# Patient Record
Sex: Female | Born: 1940 | Race: Asian | Hispanic: No | Smoking: Never smoker
Health system: Southern US, Community
[De-identification: ages and names within clinical notes are randomized; demographics above are authoritative.]

## PROBLEM LIST (undated history)

## (undated) DIAGNOSIS — I10 Essential (primary) hypertension: Secondary | ICD-10-CM

## (undated) DIAGNOSIS — E785 Hyperlipidemia, unspecified: Secondary | ICD-10-CM

## (undated) DIAGNOSIS — E119 Type 2 diabetes mellitus without complications: Secondary | ICD-10-CM

## (undated) DIAGNOSIS — J45909 Unspecified asthma, uncomplicated: Secondary | ICD-10-CM

## (undated) HISTORY — PX: ABDOMINAL HYSTERECTOMY: SHX81

## (undated) HISTORY — DX: Type 2 diabetes mellitus without complications: E11.9

## (undated) HISTORY — DX: Essential (primary) hypertension: I10

## (undated) HISTORY — DX: Hyperlipidemia, unspecified: E78.5

## (undated) HISTORY — DX: Unspecified asthma, uncomplicated: J45.909

## (undated) HISTORY — PX: CHOLECYSTECTOMY: SHX55

---

## 1997-08-23 ENCOUNTER — Emergency Department (HOSPITAL_COMMUNITY): Admission: EM | Admit: 1997-08-23 | Discharge: 1997-08-23 | Payer: Self-pay | Admitting: Emergency Medicine

## 1998-06-30 ENCOUNTER — Emergency Department (HOSPITAL_COMMUNITY): Admission: EM | Admit: 1998-06-30 | Discharge: 1998-06-30 | Payer: Self-pay | Admitting: Emergency Medicine

## 1998-06-30 ENCOUNTER — Encounter: Payer: Self-pay | Admitting: Emergency Medicine

## 1999-03-03 ENCOUNTER — Ambulatory Visit (HOSPITAL_COMMUNITY): Admission: RE | Admit: 1999-03-03 | Discharge: 1999-03-03 | Payer: Self-pay | Admitting: Family Medicine

## 1999-03-03 ENCOUNTER — Encounter: Payer: Self-pay | Admitting: Family Medicine

## 1999-03-10 ENCOUNTER — Ambulatory Visit (HOSPITAL_COMMUNITY): Admission: RE | Admit: 1999-03-10 | Discharge: 1999-03-10 | Payer: Self-pay | Admitting: Family Medicine

## 1999-03-10 ENCOUNTER — Encounter: Payer: Self-pay | Admitting: Family Medicine

## 2000-09-13 ENCOUNTER — Emergency Department (HOSPITAL_COMMUNITY): Admission: EM | Admit: 2000-09-13 | Discharge: 2000-09-14 | Payer: Self-pay | Admitting: Emergency Medicine

## 2002-05-08 ENCOUNTER — Ambulatory Visit (HOSPITAL_COMMUNITY): Admission: RE | Admit: 2002-05-08 | Discharge: 2002-05-08 | Payer: Self-pay | Admitting: *Deleted

## 2002-05-08 ENCOUNTER — Encounter: Payer: Self-pay | Admitting: *Deleted

## 2002-07-08 ENCOUNTER — Ambulatory Visit (HOSPITAL_COMMUNITY): Admission: RE | Admit: 2002-07-08 | Discharge: 2002-07-08 | Payer: Self-pay | Admitting: Gastroenterology

## 2002-07-09 ENCOUNTER — Encounter: Payer: Self-pay | Admitting: Surgery

## 2002-07-09 ENCOUNTER — Observation Stay (HOSPITAL_COMMUNITY): Admission: RE | Admit: 2002-07-09 | Discharge: 2002-07-10 | Payer: Self-pay | Admitting: Surgery

## 2004-10-21 ENCOUNTER — Emergency Department (HOSPITAL_COMMUNITY): Admission: EM | Admit: 2004-10-21 | Discharge: 2004-10-21 | Payer: Self-pay | Admitting: Emergency Medicine

## 2006-06-18 ENCOUNTER — Emergency Department (HOSPITAL_COMMUNITY): Admission: EM | Admit: 2006-06-18 | Discharge: 2006-06-18 | Payer: Self-pay | Admitting: Emergency Medicine

## 2015-12-23 ENCOUNTER — Encounter: Payer: Self-pay | Admitting: Physician Assistant

## 2015-12-23 ENCOUNTER — Ambulatory Visit (INDEPENDENT_AMBULATORY_CARE_PROVIDER_SITE_OTHER): Payer: Medicare Other | Admitting: Physician Assistant

## 2015-12-23 ENCOUNTER — Telehealth: Payer: Self-pay

## 2015-12-23 ENCOUNTER — Ambulatory Visit (INDEPENDENT_AMBULATORY_CARE_PROVIDER_SITE_OTHER): Payer: Medicare Other

## 2015-12-23 VITALS — BP 150/80 | HR 83 | Temp 98.1°F | Ht 58.5 in | Wt 114.4 lb

## 2015-12-23 DIAGNOSIS — J45909 Unspecified asthma, uncomplicated: Secondary | ICD-10-CM

## 2015-12-23 DIAGNOSIS — I10 Essential (primary) hypertension: Secondary | ICD-10-CM | POA: Diagnosis not present

## 2015-12-23 DIAGNOSIS — G8929 Other chronic pain: Secondary | ICD-10-CM

## 2015-12-23 DIAGNOSIS — M545 Low back pain, unspecified: Secondary | ICD-10-CM

## 2015-12-23 DIAGNOSIS — E785 Hyperlipidemia, unspecified: Secondary | ICD-10-CM | POA: Diagnosis not present

## 2015-12-23 DIAGNOSIS — Z7189 Other specified counseling: Secondary | ICD-10-CM

## 2015-12-23 DIAGNOSIS — E119 Type 2 diabetes mellitus without complications: Secondary | ICD-10-CM | POA: Diagnosis not present

## 2015-12-23 DIAGNOSIS — Z7689 Persons encountering health services in other specified circumstances: Secondary | ICD-10-CM

## 2015-12-23 DIAGNOSIS — M4316 Spondylolisthesis, lumbar region: Secondary | ICD-10-CM | POA: Diagnosis not present

## 2015-12-23 LAB — CBC WITH DIFFERENTIAL/PLATELET
BASOS PCT: 1 %
Basophils Absolute: 68 cells/uL (ref 0–200)
EOS PCT: 5 %
Eosinophils Absolute: 340 cells/uL (ref 15–500)
HCT: 43.2 % (ref 35.0–45.0)
Hemoglobin: 14.7 g/dL (ref 11.7–15.5)
LYMPHS PCT: 34 %
Lymphs Abs: 2312 cells/uL (ref 850–3900)
MCH: 31.4 pg (ref 27.0–33.0)
MCHC: 34 g/dL (ref 32.0–36.0)
MCV: 92.3 fL (ref 80.0–100.0)
MONO ABS: 884 {cells}/uL (ref 200–950)
MONOS PCT: 13 %
MPV: 10.1 fL (ref 7.5–12.5)
Neutro Abs: 3196 cells/uL (ref 1500–7800)
Neutrophils Relative %: 47 %
PLATELETS: 287 10*3/uL (ref 140–400)
RBC: 4.68 MIL/uL (ref 3.80–5.10)
RDW: 13.4 % (ref 11.0–15.0)
WBC: 6.8 10*3/uL (ref 3.8–10.8)

## 2015-12-23 LAB — LIPID PANEL
CHOLESTEROL: 131 mg/dL (ref 125–200)
HDL: 49 mg/dL (ref >=46)
LDL Cholesterol: 56 mg/dL (ref <130)
TRIGLYCERIDES: 129 mg/dL (ref <150)
Total CHOL/HDL Ratio: 2.7 Ratio (ref <=5.0)
VLDL: 26 mg/dL (ref <30)

## 2015-12-23 LAB — MICROALBUMIN, URINE: Microalb, Ur: 1.5 mg/dL

## 2015-12-23 LAB — TSH: TSH: 1.78 m[IU]/L

## 2015-12-23 MED ORDER — GLUCOSE BLOOD VI STRP: 1.0000 | ORAL_STRIP | 3 refills | Status: DC | PRN

## 2015-12-23 MED ORDER — ACCU-CHEK SOFT TOUCH LANCETS MISC: 1.0000 | 3 refills | Status: DC | PRN

## 2015-12-23 MED ORDER — AMLODIPINE BESYLATE 10 MG PO TABS: 10.0000 mg | ORAL_TABLET | Freq: Every day | ORAL | 3 refills | Status: AC

## 2015-12-23 MED ORDER — MELOXICAM 15 MG PO TABS: 15.0000 mg | ORAL_TABLET | Freq: Every day | ORAL | 3 refills | Status: DC

## 2015-12-23 MED ORDER — LOVASTATIN 10 MG PO TABS: 10.0000 mg | ORAL_TABLET | Freq: Every day | ORAL | 3 refills | Status: AC

## 2015-12-23 MED ORDER — BUDESONIDE-FORMOTEROL FUMARATE 80-4.5 MCG/ACT IN AERO: 2.0000 | INHALATION_SPRAY | Freq: Two times a day (BID) | RESPIRATORY_TRACT | 3 refills | Status: AC

## 2015-12-23 MED ORDER — METFORMIN HCL 500 MG PO TABS: 500.0000 mg | ORAL_TABLET | Freq: Two times a day (BID) | ORAL | 3 refills | Status: AC

## 2015-12-23 NOTE — Telephone Encounter (Signed)
Pharmacist needs a CB concerning glucose blood test strip [[id number]][[id number]]. She has [employer] insurance and they require specific directions with this med. Alfred at [phone]

## 2015-12-23 NOTE — Progress Notes (Signed)
Patient ID: Mary Patrick, female     DOB: 10/17/1940, 75 y.o.    MRN: [id number]  PCP: No PCP Per Patient, previously followed by Dr. Mayford KnifeWilliams, who's office has closed  Chief Complaint  Patient presents with  . Hip Pain    in buttock area x 3 month  . Establish Care    Subjective:    HPI  Presents for evaluation of diabetes, HTN, elevated lipids and new LEFT buttock pain, as well as to establish care.  Unfortunately, the previous office she went to has closed and she doesn't know how to get any records. We will need to access the point of care tool to see if we can identify what health maintenance items are actually outstanding. She relates that she was being seen every 6 months.  Checks glucose BID. 163 this am. 144 last night. Denies hypoglycemia. Last eye exam 9-10 months ago, usually every 2-3 years. Dental exams Q6 months  For the past month, she has experienced pain in the RIGHT buttock when she first stands up, causing her to have to lean forward, flexed at the hips. Once she moves about for several minutes, she is able to stand erect. No radicular pain. Some tingling in both feet and both hands. Diclofenac helps her chronic low back pain, but has not helped this buttock pain.    Prior to Admission medications   Medication Sig Start Date End Date Taking? Authorizing Provider  amLODipine (NORVASC) 10 MG tablet Take 10 mg by mouth daily.   Yes Historical Provider, MD  budesonide-formoterol (SYMBICORT) 80-4.5 MCG/ACT inhaler Inhale 2 puffs into the lungs 2 (two) times daily.   Yes Historical Provider, MD  diclofenac (VOLTAREN) 75 MG EC tablet Take 75 mg by mouth 2 (two) times daily.   Yes Historical Provider, MD  glucose blood test strip 1 each by Other route as needed for other. Use as instructed   Yes Historical Provider, MD  Lancets (ACCU-CHEK SOFT TOUCH) lancets 1 each by Other route as needed for other. Use as instructed   Yes Historical Provider, MD  lovastatin  (MEVACOR) 10 MG tablet Take 10 mg by mouth at bedtime.   Yes Historical Provider, MD  meloxicam (MOBIC) 15 MG tablet Take 15 mg by mouth daily.   Yes Historical Provider, MD  metFORMIN (GLUCOPHAGE) 500 MG tablet Take by mouth 2 (two) times daily with a meal.   Yes Historical Provider, MD     Not on File   Patient Active Problem List   Diagnosis Date Noted  . Diabetes type 2, controlled (HCC) 12/23/2015  . Benign essential HTN 12/23/2015  . Hyperlipidemia 12/23/2015  . Asthma, chronic 12/23/2015     No family history on file.   Social History   Social History  . Marital status: Divorced    Spouse name: n/a  . Number of children: 4  . Years of education: 7th grade   Occupational History  . retired     Building surveyorplastic manufacturing   Social History Main Topics  . Smoking status: Never Smoker  . Smokeless tobacco: Never Used  . Alcohol use 0.6 oz/week    1 Glasses of wine per week  . Drug use: No  . Sexual activity: Not on file   Other Topics Concern  . Not on file   Social History Narrative   Divorced since 1997, after about 20 years of marriage.   Lives alone.   Her children live in [place], [place] and [place], [place].  Review of Systems  Constitutional: Negative for activity change, appetite change, fatigue and unexpected weight change.  HENT: Negative for congestion, dental problem, ear pain, hearing loss, mouth sores, postnasal drip, rhinorrhea, sneezing, sore throat, tinnitus and trouble swallowing.   Eyes: Negative for photophobia, pain, redness and visual disturbance.  Respiratory: Negative for cough, chest tightness and shortness of breath.   Cardiovascular: Negative for chest pain, palpitations and leg swelling.  Gastrointestinal: Negative for abdominal pain, blood in stool, constipation, diarrhea, nausea and vomiting.  Genitourinary: Negative for dysuria, frequency, hematuria and urgency.  Musculoskeletal: Positive for back pain. Negative  for arthralgias, gait problem, myalgias and neck stiffness.  Skin: Negative for rash.  Neurological: Positive for numbness. Negative for dizziness, speech difficulty, weakness, light-headedness and headaches.  Hematological: Negative for adenopathy.  Psychiatric/Behavioral: Negative for confusion and sleep disturbance. The patient is not nervous/anxious.          Objective:  Physical Exam  Constitutional: She is oriented to person, place, and time. She appears well-developed and well-nourished. No distress.  BP (!) 150/80   Pulse 83   Temp 98.1 F (36.7 C) (Oral)   Ht 4' 10.5" (1.486 m)   Wt 114 lb 6.4 oz (51.9 kg)   LMP  (LMP Unknown)   BMI 23.50 kg/m    Eyes: Conjunctivae are normal. No scleral icterus.  Neck: Neck supple. No thyromegaly present.  Cardiovascular: Normal rate, regular rhythm, normal heart sounds and intact distal pulses.   Pulmonary/Chest: Effort normal and breath sounds normal.  Musculoskeletal:       Cervical back: Normal.       Thoracic back: Normal.       Lumbar back: She exhibits tenderness and pain. She exhibits normal range of motion, no bony tenderness and no spasm.  Lymphadenopathy:    She has no cervical adenopathy.  Neurological: She is alert and oriented to person, place, and time.  Skin: Skin is warm and dry.  Psychiatric: She has a normal mood and affect. Her speech is normal and behavior is normal.        Diabetic Foot Exam - Simple   Simple Foot Form Diabetic Foot exam was performed with the following findings:  Yes 12/23/2015 10:38 AM  Visual Inspection No deformities, no ulcerations, no other skin breakdown bilaterally:  Yes Sensation Testing See comments:  Yes Pulse Check Posterior Tibialis and Dorsalis pulse intact bilaterally:  Yes Comments No sensation to monofilament testing on the RIGHT foot, and no sensation to monofilament testing of the LEFT toes and LEFT heel.      Dg Lumbar Spine Complete  Result Date:  12/23/2015 CLINICAL DATA:  Chronic pain . EXAM: LUMBAR SPINE - COMPLETE 4+ VIEW COMPARISON:  Chest x-ray 06/07/2006 . FINDINGS: Surgical clips right upper quadrant . Lumbar scoliosis concave right. 4 mm anterolisthesis L4 on L5 . Diffuse multilevel degenerative change. Diffuse osteopenia. Aortoiliac atherosclerotic vascular calcification. Calcified injection granulomas noted the buttocks. IMPRESSION: 1. Diffuse multilevel degenerative change with 4 mm anterolisthesis L4 on L5. Lumbar spine scoliosis concave right. 2. Aortoiliac atherosclerotic vascular calcification. Electronically Signed   By: Maisie Fushomas  Register   On: 12/23/2015 11:11        Assessment & Plan:  1. Encounter to establish care  2. Controlled type 2 diabetes mellitus without complication, without long-term current use of insulin (HCC) Await labs. Adjust regimen as indicated by results. - HM Diabetes Eye Exam - HM Diabetes Foot Exam - Microalbumin, urine - metFORMIN (GLUCOPHAGE) 500 MG tablet; Take 1  tablet (500 mg total) by mouth 2 (two) times daily with a meal.  Dispense: 180 tablet; Refill: 3 - Lancets (ACCU-CHEK SOFT TOUCH) lancets; 1 each by Other route as needed for other. Use as instructed  Dispense: 100 each; Refill: 3 - glucose blood test strip; 1 each by Other route as needed for other. Use as instructed  Dispense: 100 each; Refill: 3  3. Benign essential HTN Above goal today. If renal function is normal, plan to add ACEI. - CBC with Differential/Platelet - TSH - amLODipine (NORVASC) 10 MG tablet; Take 1 tablet (10 mg total) by mouth daily.  Dispense: 90 tablet; Refill: 3  4. Hyperlipidemia Await labs. Adjust regimen as indicated by results. - Lipid panel - lovastatin (MEVACOR) 10 MG tablet; Take 1 tablet (10 mg total) by mouth at bedtime.  Dispense: 90 tablet; Refill: 3  5. Asthma, chronic, unspecified asthma severity, uncomplicated Controlled. Stable. Continue current treatment. - budesonide-formoterol  (SYMBICORT) 80-4.5 MCG/ACT inhaler; Inhale 2 puffs into the lungs 2 (two) times daily.  Dispense: 3 Inhaler; Refill: 3  6. Chronic left-sided low back pain without sciatica 7. Spondylolisthesis at L4-L5 level She cannot take both Diclofenac and meloxicam. She selects meloxicam for once daily dosing and greater benefit. Ask Dr. Dion Saucier to see her again (she has a bottle of meloxicam that he prescribed, but doesn't recall her visit with him). - DG Lumbar Spine Complete; Future - meloxicam (MOBIC) 15 MG tablet; Take 1 tablet (15 mg total) by mouth daily.  Dispense: 30 tablet; Refill: 3 - Ambulatory referral to Orthopedic Surgery   Fernande Bras, PA-C Physician Assistant-Certified [facility]

## 2015-12-23 NOTE — Patient Instructions (Signed)
     IF you received an x-ray today, you will receive an invoice from [facility]. Please contact [facility] at [phone] with questions or concerns regarding your invoice.   IF you received labwork today, you will receive an invoice from [facility]. Please contact [facility] at [phone] with questions or concerns regarding your invoice.   Our billing staff will not be able to assist you with questions regarding bills from these companies.  You will be contacted with the lab results as soon as they are available. The fastest way to get your results is to activate your My Chart account. Instructions are located on the last page of this paperwork. If you have not heard from us regarding the results in 2 weeks, please contact this office.      

## 2015-12-24 ENCOUNTER — Encounter: Payer: Self-pay | Admitting: Physician Assistant

## 2015-12-24 NOTE — Progress Notes (Signed)
Patient notified by letter

## 2015-12-26 ENCOUNTER — Other Ambulatory Visit: Payer: Self-pay

## 2015-12-26 MED ORDER — GLUCOSE BLOOD VI STRP: ORAL_STRIP | 3 refills | Status: AC

## 2015-12-26 MED ORDER — ACCU-CHEK AVIVA PLUS W/DEVICE KIT: PACK | 0 refills | Status: AC

## 2015-12-26 MED ORDER — ACCU-CHEK SOFTCLIX LANCETS MISC: 3 refills | Status: AC

## 2015-12-27 NOTE — Telephone Encounter (Signed)
I took care of this yesterday

## 2015-12-28 DIAGNOSIS — Z719 Counseling, unspecified: Secondary | ICD-10-CM

## 2015-12-28 NOTE — Congregational Nurse Program (Signed)
Congregational Nurse Program Note  Date of Encounter: 12/28/2015  Past Medical History: Past Medical History:  Diagnosis Date  . Asthma   . Diabetes mellitus without complication (HCC)   . Hyperlipidemia   . Hypertension     Encounter Details:     CNP Questionnaire - 12/28/15 2100      Patient Demographics   Is this a new or existing patient? Existing   Patient is considered a/an Immigrant   Race Asian     Patient Assistance   Location of Patient Assistance Not Applicable   Patient's financial/insurance status Medicare;Medicaid   Uninsured Patient No   Patient referred to apply for the following financial assistance Not Applicable   Food insecurities addressed Not Applicable   Transportation assistance No   Assistance securing medications Yes   Type of Assistance Other   Educational health offerings Medications;Navigating the healthcare system     Encounter Details   Primary purpose of visit Chronic Illness/Condition Visit;Acute Illness/Condition Visit;Navigating the Healthcare System   Was an Emergency Department visit averted? Not Applicable   Patient referred to Not Applicable   Was a mental health screening completed? (GAINS tool) No   Does patient have dental issues? No   Does patient have vision issues? No   Does your patient have an abnormal blood pressure today? No   Since previous encounter, have you referred patient for abnormal blood pressure that resulted in a new diagnosis or medication change? No   Does your patient have an abnormal blood glucose today? No   Since previous encounter, have you referred patient for abnormal blood glucose that resulted in a new diagnosis or medication change? No   Was there a life-saving intervention made? No      Home visit with interpreter Mary Patrick.  Reviewed medications prescribed at [facility] on [address] Last week. Still having low back pain but has not started pain medication.  Will start it  today. She was  referred to Eul[facility] Pont[facility]phy and [facility] but [facility].  Resident Service Coordinator Talmadge Coventryracy Joslin helped patient contact her Medicaid caseworker today to update Medicaid Card.Brantley Flingarolyn o'Brien RN, Congregational Nurse [phone]

## 2016-03-19 ENCOUNTER — Other Ambulatory Visit: Payer: Self-pay | Admitting: *Deleted

## 2016-03-19 ENCOUNTER — Other Ambulatory Visit: Payer: Self-pay | Admitting: Physician Assistant

## 2016-03-19 DIAGNOSIS — M545 Low back pain: Principal | ICD-10-CM

## 2016-03-19 DIAGNOSIS — N644 Mastodynia: Secondary | ICD-10-CM

## 2016-03-19 DIAGNOSIS — G8929 Other chronic pain: Secondary | ICD-10-CM

## 2016-03-19 NOTE — Telephone Encounter (Signed)
12/2015 last ov and refill 

## 2016-03-19 NOTE — Telephone Encounter (Signed)
Meds ordered this encounter  Medications  . meloxicam (MOBIC) 15 MG tablet    Sig: TAKE ONE TABLET BY MOUTH ONCE DAILY    Dispense:  30 tablet    Refill:  0    Needs OV and fasting labs.

## 2016-03-23 ENCOUNTER — Ambulatory Visit
Admission: RE | Admit: 2016-03-23 | Discharge: 2016-03-23 | Disposition: A | Discharge status: Home / Self Care | Payer: Medicare Other | Source: Ambulatory Visit | Attending: *Deleted | Admitting: *Deleted

## 2016-03-23 DIAGNOSIS — N644 Mastodynia: Secondary | ICD-10-CM

## 2016-06-27 ENCOUNTER — Telehealth: Payer: Self-pay

## 2016-06-27 NOTE — Telephone Encounter (Signed)
L/m at [facility], we have not seen since 12/2015 Has seen new provider 03/2016 and no mention of increase. Please contact that provider or have pt see us for refills/changes

## 2016-06-27 NOTE — Telephone Encounter (Signed)
[facility] is calling stating that patient told them that the provider had increased the dosage of the amaoditine 10mg  and that they need a new rx showing that   Best number [phone]

## 2016-09-26 ENCOUNTER — Other Ambulatory Visit: Payer: Self-pay

## 2017-03-30 ENCOUNTER — Other Ambulatory Visit: Payer: Self-pay | Admitting: Physician Assistant

## 2017-03-30 DIAGNOSIS — E785 Hyperlipidemia, unspecified: Secondary | ICD-10-CM

## 2017-03-30 DIAGNOSIS — J45909 Unspecified asthma, uncomplicated: Secondary | ICD-10-CM

## 2017-04-18 ENCOUNTER — Other Ambulatory Visit: Payer: Self-pay | Admitting: Physician Assistant

## 2017-04-18 DIAGNOSIS — E119 Type 2 diabetes mellitus without complications: Secondary | ICD-10-CM

## 2017-04-18 DIAGNOSIS — J45909 Unspecified asthma, uncomplicated: Secondary | ICD-10-CM

## 2017-04-18 DIAGNOSIS — E785 Hyperlipidemia, unspecified: Secondary | ICD-10-CM

## 2017-07-09 ENCOUNTER — Other Ambulatory Visit: Payer: Self-pay | Admitting: Physician Assistant

## 2017-07-09 DIAGNOSIS — E2839 Other primary ovarian failure: Secondary | ICD-10-CM

## 2017-07-22 ENCOUNTER — Ambulatory Visit
Admission: RE | Admit: 2017-07-22 | Discharge: 2017-07-22 | Disposition: A | Discharge status: Home / Self Care | Payer: Medicare Other | Source: Ambulatory Visit | Attending: Physician Assistant | Admitting: Physician Assistant

## 2017-07-22 DIAGNOSIS — E2839 Other primary ovarian failure: Secondary | ICD-10-CM

## 2017-08-12 ENCOUNTER — Encounter: Payer: Self-pay | Admitting: Physician Assistant

## 2019-11-24 ENCOUNTER — Other Ambulatory Visit: Payer: Self-pay | Admitting: Physician Assistant

## 2019-11-24 DIAGNOSIS — E2839 Other primary ovarian failure: Secondary | ICD-10-CM

## 2020-01-28 ENCOUNTER — Other Ambulatory Visit (HOSPITAL_COMMUNITY): Payer: Self-pay | Admitting: Registered Nurse

## 2020-01-28 DIAGNOSIS — I509 Heart failure, unspecified: Secondary | ICD-10-CM

## 2020-02-15 ENCOUNTER — Other Ambulatory Visit (HOSPITAL_COMMUNITY): Payer: Medicare Other

## 2020-03-09 ENCOUNTER — Other Ambulatory Visit (HOSPITAL_COMMUNITY): Payer: Medicare Other

## 2020-03-09 ENCOUNTER — Encounter (HOSPITAL_COMMUNITY): Payer: Self-pay

## 2020-03-09 NOTE — Progress Notes (Signed)
Verified appointment "no show" status with Henriette Combs at 10:29.

## 2020-04-05 ENCOUNTER — Other Ambulatory Visit: Payer: Self-pay

## 2020-04-05 ENCOUNTER — Ambulatory Visit
Admission: RE | Admit: 2020-04-05 | Discharge: 2020-04-05 | Disposition: A | Discharge status: Home / Self Care | Payer: Medicare Other | Source: Ambulatory Visit | Attending: Physician Assistant | Admitting: Physician Assistant

## 2020-04-05 DIAGNOSIS — E2839 Other primary ovarian failure: Secondary | ICD-10-CM

## 2020-04-06 ENCOUNTER — Ambulatory Visit (HOSPITAL_COMMUNITY): Payer: Medicare Other | Attending: Registered Nurse

## 2020-04-06 DIAGNOSIS — I509 Heart failure, unspecified: Secondary | ICD-10-CM | POA: Insufficient documentation

## 2020-04-06 LAB — ECHOCARDIOGRAM COMPLETE
Area-P 1/2: 2.57 cm2
S' Lateral: 2.5 cm

## 2020-12-28 ENCOUNTER — Telehealth: Payer: Self-pay

## 2020-12-28 NOTE — Telephone Encounter (Signed)
Patient had asked interpreter Diu Hartshorn if we could help her order a medical alert necklace.  Order placed through same company her neighbor has.  Due to lack of funding available today ordered will be processed on 01/10/2021 and should deliver 4-6 days later.  Brantley Fling RN, Congregational Nurse [phone]

## 2021-01-13 ENCOUNTER — Other Ambulatory Visit: Payer: Self-pay | Admitting: Registered Nurse

## 2021-01-13 DIAGNOSIS — R7401 Elevation of levels of liver transaminase levels: Secondary | ICD-10-CM

## 2021-01-19 NOTE — Congregational Nurse Program (Signed)
Home visit to help patient activate medical alert device which she just received. We did a test call and it was working properly. She will place lockbox with house key on window sill outside front door. She questioned if it would work in other states when she travelled.  Phone call to tech support where we learned it works anywhere in [place].  She just has to call customer service to let them know the address of where she is staying and then let them know when she returns home.  Patient signed service agreement and will mail it to them.  Advised patient she could contact CN through interpreter Diu Hartshorn if she has future questions.  Brantley Fling RN, Congregational Nurse [phone]

## 2021-01-24 ENCOUNTER — Other Ambulatory Visit: Payer: Medicare Other

## 2021-01-30 ENCOUNTER — Ambulatory Visit
Admission: RE | Admit: 2021-01-30 | Discharge: 2021-01-30 | Disposition: A | Discharge status: Home / Self Care | Payer: Medicare Other | Source: Ambulatory Visit | Attending: Registered Nurse | Admitting: Registered Nurse

## 2021-01-30 DIAGNOSIS — R7401 Elevation of levels of liver transaminase levels: Secondary | ICD-10-CM

## 2021-06-20 ENCOUNTER — Other Ambulatory Visit (HOSPITAL_COMMUNITY): Payer: Self-pay | Admitting: Gastroenterology

## 2021-06-20 DIAGNOSIS — K754 Autoimmune hepatitis: Secondary | ICD-10-CM

## 2021-06-30 ENCOUNTER — Other Ambulatory Visit (HOSPITAL_COMMUNITY): Payer: Self-pay | Admitting: Physician Assistant

## 2021-07-01 ENCOUNTER — Other Ambulatory Visit: Payer: Self-pay | Admitting: Student

## 2021-07-03 ENCOUNTER — Other Ambulatory Visit: Payer: Self-pay

## 2021-07-03 ENCOUNTER — Encounter (HOSPITAL_COMMUNITY): Payer: Self-pay

## 2021-07-03 ENCOUNTER — Ambulatory Visit (HOSPITAL_COMMUNITY)
Admission: RE | Admit: 2021-07-03 | Discharge: 2021-07-03 | Disposition: A | Discharge status: Home / Self Care | Payer: Medicare Other | Source: Ambulatory Visit | Attending: Gastroenterology | Admitting: Gastroenterology

## 2021-07-03 DIAGNOSIS — K754 Autoimmune hepatitis: Secondary | ICD-10-CM | POA: Insufficient documentation

## 2021-07-03 DIAGNOSIS — R7989 Other specified abnormal findings of blood chemistry: Secondary | ICD-10-CM | POA: Insufficient documentation

## 2021-07-03 LAB — CBC
HCT: 39.7 % (ref 36.0–46.0)
Hemoglobin: 13.6 g/dL (ref 12.0–15.0)
MCH: 31.6 pg (ref 26.0–34.0)
MCHC: 34.3 g/dL (ref 30.0–36.0)
MCV: 92.1 fL (ref 80.0–100.0)
Platelets: 238 10*3/uL (ref 150–400)
RBC: 4.31 MIL/uL (ref 3.87–5.11)
RDW: 13 % (ref 11.5–15.5)
WBC: 10 10*3/uL (ref 4.0–10.5)
nRBC: 0 % (ref 0.0–0.2)

## 2021-07-03 LAB — GLUCOSE, CAPILLARY: Glucose-Capillary: 128 mg/dL — ABNORMAL HIGH (ref 70–99)

## 2021-07-03 LAB — PROTIME-INR
INR: 1 (ref 0.8–1.2)
Prothrombin Time: 13.3 seconds (ref 11.4–15.2)

## 2021-07-03 MED ORDER — GELATIN ABSORBABLE 12-7 MM EX MISC
CUTANEOUS | Status: AC
  Filled 2021-07-03: qty 1

## 2021-07-03 MED ORDER — LIDOCAINE HCL (PF) 1 % IJ SOLN
INTRAMUSCULAR | Status: AC
  Filled 2021-07-03: qty 30

## 2021-07-03 MED ORDER — MIDAZOLAM HCL 2 MG/2ML IJ SOLN
INTRAMUSCULAR | Status: AC
  Filled 2021-07-03: qty 4

## 2021-07-03 MED ORDER — HYDROCODONE-ACETAMINOPHEN 5-325 MG PO TABS: 1.0000 | ORAL_TABLET | ORAL | Status: DC | PRN

## 2021-07-03 MED ORDER — SODIUM CHLORIDE 0.9 % IV SOLN: INTRAVENOUS | Status: DC

## 2021-07-03 MED ORDER — FENTANYL CITRATE (PF) 100 MCG/2ML IJ SOLN
INTRAMUSCULAR | Status: DC | PRN
  Administered 2021-07-03: 50 ug via INTRAVENOUS

## 2021-07-03 MED ORDER — FENTANYL CITRATE (PF) 100 MCG/2ML IJ SOLN
INTRAMUSCULAR | Status: AC
  Filled 2021-07-03: qty 4

## 2021-07-03 MED ORDER — MIDAZOLAM HCL 2 MG/2ML IJ SOLN
INTRAMUSCULAR | Status: DC | PRN
  Administered 2021-07-03: 1 mg via INTRAVENOUS
  Administered 2021-07-03: .5 mg via INTRAVENOUS

## 2021-07-03 NOTE — Progress Notes (Signed)
Interpretor in [employer]

## 2021-07-03 NOTE — Procedures (Signed)
°  Procedure: US liver core biopsy R lobe x2 EBL:   minimal Complications:  none immediate  See full dictation in [employer].  Thora Lance MD Main # [phone] Pager  [phone] Mobile [phone]

## 2021-07-03 NOTE — H&P (Signed)
Chief Complaint: Patient was seen in consultation today for random liver core biopsy at the request of Ronnette Juniper  Referring Physician(s): Ronnette Juniper  Supervising Physician: Michaelle Birks  Patient Status: [facility] - Out-pt  History of Present Illness: Mary Patrick is a 81 y.o. female    Elevated liver functions-- Elevated ASMA Noted since Sept 2022 Follows with Dr Therisa Doyne  Requesting liver core biopsy   Past Medical History:  Diagnosis Date   Asthma    Diabetes mellitus without complication ([place])    Hyperlipidemia    Hypertension     Past Surgical History:  Procedure Laterality Date   ABDOMINAL HYSTERECTOMY     CHOLECYSTECTOMY      Allergies: Lisinopril  Medications: Prior to Admission medications   Medication Sig Start Date End Date Taking? Authorizing Provider  amLODipine (NORVASC) 10 MG tablet Take 1 tablet (10 mg total) by mouth daily. 12/23/15  Yes Jeffery, Domingo Mend, PA  budesonide-formoterol (SYMBICORT) 80-4.5 MCG/ACT inhaler Inhale 2 puffs into the lungs 2 (two) times daily. 12/23/15  Yes Jeffery, Chelle, PA  carvedilol (COREG) 6.25 MG tablet Take 6.25 mg by mouth 2 (two) times daily. 06/12/21  Yes [provider]  gabapentin (NEURONTIN) 100 MG capsule Take 100 mg by mouth 2 (two) times daily. 06/12/21  Yes [provider]  JANUVIA 100 MG tablet Take 100 mg by mouth daily. 06/12/21  Yes [provider]  losartan (COZAAR) 100 MG tablet Take 100 mg by mouth daily. 06/12/21  Yes [provider]  metFORMIN (GLUCOPHAGE) 500 MG tablet Take 1 tablet (500 mg total) by mouth 2 (two) times daily with a meal. 12/23/15  Yes Jeffery, Chelle, PA  VITAMIN D HIGH POTENCY 25 MCG (1000 UT) capsule Take 1,000 Units by mouth daily. 06/12/21  Yes [provider]  ACCU-CHEK SOFTCLIX LANCETS lancets Test blood sugar once daily. Dx: E11.9 12/26/15   Harrison Mons, PA  Blood Glucose Monitoring Suppl (ACCU-CHEK AVIVA PLUS) w/Device KIT Test blood sugar  once daily. Dx: E11.9 12/26/15   Harrison Mons, PA  glucose blood (ACCU-CHEK AVIVA PLUS) test strip Test blood sugar once daily. Dx: E11.9 12/26/15   Harrison Mons, PA  lovastatin (MEVACOR) 10 MG tablet Take 1 tablet (10 mg total) by mouth at bedtime. Patient not taking: Reported on 07/03/2021 12/23/15   Harrison Mons, PA  meloxicam ([place]) 15 MG tablet TAKE ONE TABLET BY MOUTH ONCE DAILY Patient not taking: Reported on 07/03/2021 03/19/16   Harrison Mons, PA     History reviewed. No pertinent family history.  Social History   Socioeconomic History   Marital status: Divorced    Spouse name: n/a   Number of children: 4   Years of education: 7th grade   Highest education level: Not on file  Occupational History   Occupation: retired    Comment: Systems developer  Tobacco Use   Smoking status: Never   Smokeless tobacco: Never  Substance and Sexual Activity   Alcohol use: Yes    Alcohol/week: 1.0 standard drink    Types: 1 Glasses of wine per week   Drug use: No   Sexual activity: Not on file  Other Topics Concern   Not on file  Social History Narrative   Divorced since 1997, after about 20 years of marriage.   Lives alone.   Her children live in [place], [place] and [place], [place], [place].   Social Determinants of Health   Financial Resource Strain: Not on file  Food Insecurity: Not on file  Transportation Needs:  Not on file  Physical Activity: Not on file  Stress: Not on file  Social Connections: Not on file     Review of Systems: A 12 point ROS discussed and pertinent positives are indicated in the HPI above.  All other systems are negative.    Vital Signs: BP 134/67    Pulse 69    Temp 98.2 F (36.8 C) (Oral)    Resp 17    Ht _0  (1.27 m)    Wt 115 lb (52.2 kg)    LMP  (LMP Unknown)    SpO2 98%    BMI 32.34 kg/m   Physical Exam Vitals reviewed.  HENT:     Mouth/Throat:     Mouth: Mucous membranes are moist.  Cardiovascular:     Rate and  Rhythm: Normal rate and regular rhythm.     Heart sounds: Normal heart sounds.  Pulmonary:     Effort: Pulmonary effort is normal.     Breath sounds: Normal breath sounds.  Abdominal:     Palpations: Abdomen is soft.     Tenderness: There is no abdominal tenderness.  Musculoskeletal:        General: Normal range of motion.  Skin:    General: Skin is warm.  Neurological:     Mental Status: She is alert and oriented to person, place, and time.  Psychiatric:        Behavior: Behavior normal.    Imaging: No results found.  Labs:  CBC: No results for input(s): WBC, HGB, HCT, PLT in the last 8760 hours.  COAGS: No results for input(s): INR, APTT in the last 8760 hours.  BMP: No results for input(s): NA, K, CL, CO2, GLUCOSE, BUN, CALCIUM, CREATININE, GFRNONAA, GFRAA in the last 8760 hours.  Invalid input(s): CMP  LIVER FUNCTION TESTS: No results for input(s): BILITOT, AST, ALT, ALKPHOS, PROT, ALBUMIN in the last 8760 hours.  TUMOR MARKERS: No results for input(s): AFPTM, CEA, CA199, CHROMGRNA in the last 8760 hours.  Assessment and Plan:  Elevated LFTs Elevated ASMA GI asking for random liver core biopsy Risks and benefits of random liver core biopsy was discussed with the patient and/or patient's family (with interpreter in room) including, but not limited to bleeding, infection, damage to adjacent structures or low yield requiring additional tests.  All of the questions were answered and there is agreement to proceed Consent signed and in chart.   Thank you for this interesting consult.  I greatly enjoyed meeting Zitlali Gerken and look forward to participating in their care.  A copy of this report was sent to the requesting provider on this date.  Electronically Signed: Lavonia Drafts, PA-C 07/03/2021, 12:13 PM   I spent a total of  30 Minutes   in face to face in clinical consultation, greater than 50% of which was counseling/coordinating care for random liver core  bx

## 2021-07-04 LAB — SURGICAL PATHOLOGY

## 2021-09-02 ENCOUNTER — Emergency Department (HOSPITAL_COMMUNITY): Payer: Medicare Other

## 2021-09-02 ENCOUNTER — Emergency Department (HOSPITAL_COMMUNITY)
Admission: EM | Admit: 2021-09-02 | Discharge: 2021-09-03 | Disposition: A | Discharge status: Home / Self Care | Payer: Medicare Other | Attending: Emergency Medicine | Admitting: Emergency Medicine

## 2021-09-02 DIAGNOSIS — R55 Syncope and collapse: Secondary | ICD-10-CM | POA: Diagnosis not present

## 2021-09-02 DIAGNOSIS — Z79899 Other long term (current) drug therapy: Secondary | ICD-10-CM | POA: Insufficient documentation

## 2021-09-02 DIAGNOSIS — R079 Chest pain, unspecified: Secondary | ICD-10-CM | POA: Diagnosis not present

## 2021-09-02 DIAGNOSIS — I10 Essential (primary) hypertension: Secondary | ICD-10-CM | POA: Insufficient documentation

## 2021-09-02 DIAGNOSIS — W01198A Fall on same level from slipping, tripping and stumbling with subsequent striking against other object, initial encounter: Secondary | ICD-10-CM | POA: Insufficient documentation

## 2021-09-02 DIAGNOSIS — Z7984 Long term (current) use of oral hypoglycemic drugs: Secondary | ICD-10-CM | POA: Insufficient documentation

## 2021-09-02 DIAGNOSIS — Y92002 Bathroom of unspecified non-institutional (private) residence single-family (private) house as the place of occurrence of the external cause: Secondary | ICD-10-CM | POA: Diagnosis not present

## 2021-09-02 DIAGNOSIS — E119 Type 2 diabetes mellitus without complications: Secondary | ICD-10-CM | POA: Insufficient documentation

## 2021-09-02 DIAGNOSIS — M546 Pain in thoracic spine: Secondary | ICD-10-CM | POA: Insufficient documentation

## 2021-09-02 LAB — BASIC METABOLIC PANEL
Anion gap: 11 (ref 5–15)
BUN: 9 mg/dL (ref 8–23)
CO2: 21 mmol/L — ABNORMAL LOW (ref 22–32)
Calcium: 9.4 mg/dL (ref 8.9–10.3)
Chloride: 99 mmol/L (ref 98–111)
Creatinine, Ser: 0.62 mg/dL (ref 0.44–1.00)
GFR, Estimated: >60 mL/min (ref >60)
Glucose, Bld: 149 mg/dL — ABNORMAL HIGH (ref 70–99)
Potassium: 4.1 mmol/L (ref 3.5–5.1)
Sodium: 131 mmol/L — ABNORMAL LOW (ref 135–145)

## 2021-09-02 LAB — CBC WITH DIFFERENTIAL/PLATELET
Abs Immature Granulocytes: 0.07 10*3/uL (ref 0.00–0.07)
Basophils Absolute: 0.1 10*3/uL (ref 0.0–0.1)
Basophils Relative: 1 %
Eosinophils Absolute: 0.1 10*3/uL (ref 0.0–0.5)
Eosinophils Relative: 1 %
HCT: 39.2 % (ref 36.0–46.0)
Hemoglobin: 13.6 g/dL (ref 12.0–15.0)
Immature Granulocytes: 1 %
Lymphocytes Relative: 19 %
Lymphs Abs: 2.5 10*3/uL (ref 0.7–4.0)
MCH: 32.2 pg (ref 26.0–34.0)
MCHC: 34.7 g/dL (ref 30.0–36.0)
MCV: 92.9 fL (ref 80.0–100.0)
Monocytes Absolute: 1.4 10*3/uL — ABNORMAL HIGH (ref 0.1–1.0)
Monocytes Relative: 10 %
Neutro Abs: 9.5 10*3/uL — ABNORMAL HIGH (ref 1.7–7.7)
Neutrophils Relative %: 68 %
Platelets: 251 10*3/uL (ref 150–400)
RBC: 4.22 MIL/uL (ref 3.87–5.11)
RDW: 13.2 % (ref 11.5–15.5)
WBC: 13.7 10*3/uL — ABNORMAL HIGH (ref 4.0–10.5)
nRBC: 0 % (ref 0.0–0.2)

## 2021-09-02 LAB — D-DIMER, QUANTITATIVE: D-Dimer, Quant: 0.37 ug/mL-FEU (ref 0.00–0.50)

## 2021-09-02 LAB — TROPONIN I (HIGH SENSITIVITY)
Troponin I (High Sensitivity): 5 ng/L (ref <18)
Troponin I (High Sensitivity): 4 ng/L (ref <18)

## 2021-09-02 MED ORDER — LIDOCAINE 5 % EX PTCH
1.0000 | MEDICATED_PATCH | CUTANEOUS | Status: DC
  Administered 2021-09-02: 1 via TRANSDERMAL
  Filled 2021-09-02: qty 1

## 2021-09-02 MED ORDER — LIDOCAINE 5 % EX PTCH: 1.0000 | MEDICATED_PATCH | CUTANEOUS | 0 refills | Status: AC

## 2021-09-02 MED ORDER — KETOROLAC TROMETHAMINE 15 MG/ML IJ SOLN
15.0000 mg | Freq: Once | INTRAMUSCULAR | Status: AC
  Administered 2021-09-02: 15 mg via INTRAVENOUS
  Filled 2021-09-02: qty 1

## 2021-09-02 MED ORDER — IOHEXOL 300 MG/ML  SOLN
100.0000 mL | Freq: Once | INTRAMUSCULAR | Status: AC | PRN
  Administered 2021-09-02: 100 mL via INTRAVENOUS

## 2021-09-02 MED ORDER — IBUPROFEN 600 MG PO TABS: 600.0000 mg | ORAL_TABLET | Freq: Four times a day (QID) | ORAL | 0 refills | Status: AC | PRN

## 2021-09-02 NOTE — ED Notes (Signed)
Patient ambulated to and from restroom with standby assistance. ?

## 2021-09-02 NOTE — ED Notes (Signed)
Patient given clothing.  Patient has no other belongings with her.  ?

## 2021-09-02 NOTE — ED Triage Notes (Signed)
Pt BIB [facility] from home d/t fall from last night in the bathroom. Pt stated it was mechanical fall. Denied hitting head or LOC. No blood thinner. Pt is diabetic. C/O severe L shoulder pain that appears swollen, warm to touch. A&O X4. VSS.  ?

## 2021-09-02 NOTE — ED Provider Notes (Addendum)
?[facility] ?Provider Note ? ? ?CSN: [id number] ?Arrival date & time: 09/02/21  1550 ? ?  ? ?History ? ?Chief Complaint  ?Patient presents with  ? Fall  ? ? ?Mary Patrick is a 81 y.o. female. ? ?Patient is a 81 year old female with past medical history of hypertension, hyperlipidemia, and diabetes with medications presenting for complaints of near-syncope.  Patient states last night she got up to go to the bathroom and when she tempted to stand up from the toilet she began to feel like she was going to pass out.  Denies any lightheadedness, dizziness, nausea, or dizziness.  States she just felt like she was going to go down.  She states she fell forward caught herself against the wall on her left arm.  Denies any left arm pain.  Denies head trauma.  Admits to left-sided upper back pain worse with deep breathing, without radiation since episode.  Prior history of DVT or PE. ? ?The history is provided by the patient. No language interpreter was used.  ?Fall ?Associated symptoms include shortness of breath. Pertinent negatives include no chest pain and no abdominal pain.  ? ?  ? ?Home Medications ?Prior to Admission medications   ?Medication Sig Start Date End Date Taking? Authorizing Provider  ?ibuprofen (ADVIL) 600 MG tablet Take 1 tablet (600 mg total) by mouth every 6 (six) hours as needed for mild pain. 08/14/79  Yes Campbell Stall P, DO  ?lidocaine (LIDODERM) 5 % Place 1 patch onto the skin daily. Remove & Discard patch within 12 hours or as directed by MD 1/91/47  Yes Lianne Cure, DO  ?ACCU-CHEK SOFTCLIX LANCETS lancets Test blood sugar once daily. Dx: E11.9 12/26/15   Harrison Mons, PA  ?amLODipine (NORVASC) 10 MG tablet Take 1 tablet (10 mg total) by mouth daily. 12/23/15   Harrison Mons, [place]  ?ANORO ELLIPTA 62.5-25 MCG/ACT AEPB Inhale 1 puff into the lungs daily. 06/12/21   [provider]  ?Blood Glucose Monitoring Suppl (ACCU-CHEK AVIVA PLUS) w/Device KIT Test  blood sugar once daily. Dx: E11.9 12/26/15   Harrison Mons, PA  ?budesonide-formoterol (SYMBICORT) 80-4.5 MCG/ACT inhaler Inhale 2 puffs into the lungs 2 (two) times daily. 12/23/15   Harrison Mons, PA  ?carvedilol (COREG) 6.25 MG tablet Take 6.25 mg by mouth 2 (two) times daily. 06/12/21   [provider]  ?gabapentin (NEURONTIN) 100 MG capsule Take 100 mg by mouth 2 (two) times daily. 06/12/21   [provider]  ?glucose blood (ACCU-CHEK AVIVA PLUS) test strip Test blood sugar once daily. Dx: E11.9 12/26/15   Harrison Mons, PA  ?JANUVIA 100 MG tablet Take 100 mg by mouth daily. 06/12/21   [provider]  ?losartan (COZAAR) 100 MG tablet Take 100 mg by mouth daily. 06/12/21   [provider]  ?lovastatin (MEVACOR) 10 MG tablet Take 1 tablet (10 mg total) by mouth at bedtime. ?Patient not taking: Reported on 07/03/2021 12/23/15   Harrison Mons, PA  ?meloxicam (MOBIC) 15 MG tablet TAKE ONE TABLET BY MOUTH ONCE DAILY ?Patient not taking: Reported on 07/03/2021 03/19/16   Harrison Mons, PA  ?metFORMIN (GLUCOPHAGE) 500 MG tablet Take 1 tablet (500 mg total) by mouth 2 (two) times daily with a meal. 12/23/15   Harrison Mons, PA  ?montelukast (SINGULAIR) 10 MG tablet Take 10 mg by mouth at bedtime.    [provider]  ?VITAMIN D HIGH POTENCY 25 MCG (1000 UT) capsule Take 1,000 Units by mouth daily. 06/12/21  [provider]  ?   ? ?Allergies    ?Lisinopril   ? ?Review of Systems   ?Review of Systems  ?Constitutional:  Negative for chills and fever.  ?HENT:  Negative for ear pain and sore throat.   ?Eyes:  Negative for pain and visual disturbance.  ?Respiratory:  Positive for shortness of breath. Negative for cough.   ?Cardiovascular:  Negative for chest pain and palpitations.  ?Gastrointestinal:  Negative for abdominal pain and vomiting.  ?Genitourinary:  Negative for dysuria and hematuria.  ?Musculoskeletal:  Positive for back pain. Negative for arthralgias.  ?Skin:   Negative for color change and rash.  ?Neurological:  Negative for dizziness, seizures and light-headedness.  ?     Near-syncope ?  ?All other systems reviewed and are negative. ? ?Physical Exam ?Updated Vital Signs ?BP (!) 154/62   Pulse 74   Temp 98.6 ?F (37 ?C) (Oral)   Resp (!) 22   LMP  (LMP Unknown)   SpO2 96%  ?Physical Exam ?Vitals and nursing note reviewed.  ?Constitutional:   ?   General: She is not in acute distress. ?   Appearance: She is well-developed.  ?HENT:  ?   Head: Normocephalic and atraumatic.  ?Eyes:  ?   Conjunctiva/sclera: Conjunctivae normal.  ?Cardiovascular:  ?   Rate and Rhythm: Normal rate and regular rhythm.  ?   Pulses:     ?     Radial pulses are 2+ on the right side and 2+ on the left side.  ?     Dorsalis pedis pulses are 2+ on the right side and 2+ on the left side.  ?   Heart sounds: No murmur heard. ?Pulmonary:  ?   Effort: Pulmonary effort is normal. No respiratory distress.  ?   Breath sounds: Normal breath sounds.  ?Abdominal:  ?   Palpations: Abdomen is soft.  ?   Tenderness: There is no abdominal tenderness.  ?Musculoskeletal:     ?   General: No swelling.  ?   Cervical back: Neck supple.  ?   Right lower leg: No edema.  ?   Left lower leg: No edema.  ?Skin: ?   General: Skin is warm and dry.  ?   Capillary Refill: Capillary refill takes less than 2 seconds.  ?Neurological:  ?   Mental Status: She is alert.  ?Psychiatric:     ?   Mood and Affect: Mood normal.  ? ? ?ED Results / Procedures / Treatments   ?Labs ?(all labs ordered are listed, but only abnormal results are displayed) ?Labs Reviewed  ?CBC WITH DIFFERENTIAL/PLATELET - Abnormal; Notable for the following components:  ?    Result Value  ? WBC 13.7 (*)   ? Neutro Abs 9.5 (*)   ? Monocytes Absolute 1.4 (*)   ? All other components within normal limits  ?BASIC METABOLIC PANEL - Abnormal; Notable for the following components:  ? Sodium 131 (*)   ? CO2 21 (*)   ? Glucose, Bld 149 (*)   ? All other components within  normal limits  ?D-DIMER, QUANTITATIVE  ?TROPONIN I (HIGH SENSITIVITY)  ?TROPONIN I (HIGH SENSITIVITY)  ? ? ?EKG ?EKG Interpretation ? ?Date/Time:  Saturday September 02 2021 16:44:57 EDT ?Ventricular Rate:  85 ?PR Interval:  157 ?QRS Duration: 82 ?QT Interval:  359 ?QTC Calculation: 427 ?R Axis:   45 ?Text Interpretation: Sinus rhythm Confirmed by Lennice Sites ([id number]) on 09/04/2021 10:58:15 AM ? ?Radiology ?No results found. ? ?  Procedures ?Procedures  ? ? ?Medications Ordered in ED ?Medications  ?iohexol (OMNIPAQUE) 300 MG/ML solution 100 mL (100 mLs Intravenous Contrast Given 09/02/21 1959)  ?ketorolac (TORADOL) 15 MG/ML injection 15 mg (15 mg Intravenous Given 09/02/21 2336)  ? ? ?ED Course/ Medical Decision Making/ A&P ?  ?                        ?Medical Decision Making ?Amount and/or Complexity of Data Reviewed ?Labs: ordered. ?Radiology: ordered. ?ECG/medicine tests: ordered. ? ?Risk ?Prescription drug management. ? ? ?75:32 PM ?81 year old female with past medical history of hypertension, hyperlipidemia, and diabetes with medications presenting for complaints of near-syncope.  Is alert and oriented x3, no acute distress, afebrile, stable vital signs.  Physical exam demonstrates reproducible tenderness with deep breathing and palpation of left-sided thoracic paraspinal muscles. ? ?ACS considered however not to be unlikely.  EKG as interpreted by myself demonstrates no ST segment elevation or depression.  No T wave inversions.  Stable intervals.  Troponin within normal limits x2.  Stable electrolytes.  Stable chest x-ray. ? ?No orthostatic hypotension. ?No anemia. ?Stable glucose. ?D-dimer negative.  Low suspicion PE. ?Stable CT chest. ? ?Back pain thought to be musculoskeletal in nature.  Patient in no distress and overall condition improved here in the ED. Detailed discussions were had with the patient regarding current findings, and need for close f/u with PCP or on call doctor. The patient has been instructed to  return immediately if the symptoms worsen in any way for re-evaluation. Patient verbalized understanding and is in agreement with current care plan. All questions answered prior to discharge. ? ? ? ? ? ? ? ?Final

## 2021-11-13 ENCOUNTER — Emergency Department (HOSPITAL_COMMUNITY): Payer: Medicare Other

## 2021-11-13 ENCOUNTER — Encounter (HOSPITAL_COMMUNITY): Payer: Self-pay

## 2021-11-13 ENCOUNTER — Other Ambulatory Visit: Payer: Self-pay

## 2021-11-13 ENCOUNTER — Emergency Department (HOSPITAL_COMMUNITY)
Admission: EM | Admit: 2021-11-13 | Discharge: 2021-11-13 | Disposition: A | Discharge status: Home / Self Care | Payer: Medicare Other | Attending: Emergency Medicine | Admitting: Emergency Medicine

## 2021-11-13 DIAGNOSIS — S60211A Contusion of right wrist, initial encounter: Secondary | ICD-10-CM

## 2021-11-13 DIAGNOSIS — M25561 Pain in right knee: Secondary | ICD-10-CM | POA: Diagnosis not present

## 2021-11-13 DIAGNOSIS — S60221A Contusion of right hand, initial encounter: Secondary | ICD-10-CM | POA: Diagnosis not present

## 2021-11-13 DIAGNOSIS — W19XXXA Unspecified fall, initial encounter: Secondary | ICD-10-CM | POA: Insufficient documentation

## 2021-11-13 DIAGNOSIS — S6991XA Unspecified injury of right wrist, hand and finger(s), initial encounter: Secondary | ICD-10-CM | POA: Diagnosis present

## 2021-11-13 DIAGNOSIS — Z79899 Other long term (current) drug therapy: Secondary | ICD-10-CM | POA: Insufficient documentation

## 2021-11-13 NOTE — ED Triage Notes (Signed)
Pt coming from home with c/o a fall. Pt states that she tripped and fell catching herself with her hands and knees. Pt c/o right wrist and knee pain. Swelling noted to right wrist and right knee. Lac noted on left knee. No blood thinners, denies LOC or hitting head.

## 2021-11-13 NOTE — ED Provider Notes (Signed)
[place] DEPT Provider Note   CSN: [id number] Arrival date & time: 11/13/21  1621     History  Chief Complaint  Patient presents with   Fall    Paiten Glaeser is a 81 y.o. female.   Fall  Patient is an 81 year old female been me speaking but understands [place].  Her daughter at bedside is translating for her.  She deferred formal translator.  Patient is coming to emergency room today after a mechanical fall that occurred today.  She states that she tripped and fell forward catching herself with her hands and putting weight on her right knee.  She states that she landed on a hardwood floor.  She denies any head injury or loss of consciousness and vomiting blood thinners denies nausea vomiting or any other pain apart from right wrist and hand pain and right knee pain.  She states her left upper and lower extremity are without pain.  She has not had any medications prior to arrival.   She denies any other associated symptoms.     Home Medications Prior to Admission medications   Medication Sig Start Date End Date Taking? Authorizing Provider  ACCU-CHEK SOFTCLIX LANCETS lancets Test blood sugar once daily. Dx: E11.9 12/26/15   Harrison Mons, PA  amLODipine (NORVASC) 10 MG tablet Take 1 tablet (10 mg total) by mouth daily. 12/23/15   Harrison Mons, PA  ANORO ELLIPTA 62.5-25 MCG/ACT AEPB Inhale 1 puff into the lungs daily. 06/12/21   [provider]  Blood Glucose Monitoring Suppl (ACCU-CHEK AVIVA PLUS) w/Device KIT Test blood sugar once daily. Dx: E11.9 12/26/15   Harrison Mons, PA  budesonide-formoterol (SYMBICORT) 80-4.5 MCG/ACT inhaler Inhale 2 puffs into the lungs 2 (two) times daily. 12/23/15   Harrison Mons, PA  carvedilol (COREG) 6.25 MG tablet Take 6.25 mg by mouth 2 (two) times daily. 06/12/21   [provider]  gabapentin (NEURONTIN) 100 MG capsule Take 100 mg by mouth 2 (two) times daily. 06/12/21   [provider]  glucose blood (ACCU-CHEK AVIVA PLUS) test strip Test blood sugar once daily. Dx: E11.9 12/26/15   Harrison Mons, PA  ibuprofen (ADVIL) 600 MG tablet Take 1 tablet (600 mg total) by mouth every 6 (six) hours as needed for mild pain. 3/81/82   Campbell Stall P, DO  JANUVIA 100 MG tablet Take 100 mg by mouth daily. 06/12/21   [provider]  lidocaine (LIDODERM) 5 % Place 1 patch onto the skin daily. Remove & Discard patch within 12 hours or as directed by MD 9/93/71   Campbell Stall P, DO  losartan (COZAAR) 100 MG tablet Take 100 mg by mouth daily. 06/12/21   [provider]  lovastatin (MEVACOR) 10 MG tablet Take 1 tablet (10 mg total) by mouth at bedtime. Patient not taking: Reported on 07/03/2021 12/23/15   Harrison Mons, PA  meloxicam (MOBIC) 15 MG tablet TAKE ONE TABLET BY MOUTH ONCE DAILY Patient not taking: Reported on 07/03/2021 03/19/16   Harrison Mons, PA  metFORMIN (GLUCOPHAGE) 500 MG tablet Take 1 tablet (500 mg total) by mouth 2 (two) times daily with a meal. 12/23/15   Jeffery, [place], PA  montelukast (SINGULAIR) 10 MG tablet Take 10 mg by mouth at bedtime.    [provider]  VITAMIN D HIGH POTENCY 25 MCG (1000 UT) capsule Take 1,000 Units by mouth daily. 06/12/21   [provider]      Allergies    Lisinopril    Review  of Systems   Review of Systems  Physical Exam Updated Vital Signs BP (!) 171/63 (BP Location: Left Arm)   Pulse 69   Temp 98.6 F (37 C)   Resp 16   Ht '4\' 2"'  (1.27 m)   Wt 52.6 kg   LMP  (LMP Unknown)   SpO2 100%   BMI 32.62 kg/m  Physical Exam Vitals and nursing note reviewed.  Constitutional:      General: She is not in acute distress.    Comments: Pleasant well-appearing 81 year old.  In no acute distress.  Sitting comfortably in bed.  Able answer questions appropriately follow commands. No increased work of breathing. Speaking in full sentences.   HENT:     Head: Normocephalic and atraumatic.     Nose: Nose  normal.  Eyes:     General: No scleral icterus. Cardiovascular:     Rate and Rhythm: Normal rate and regular rhythm.     Pulses: Normal pulses.     Heart sounds: Normal heart sounds.  Pulmonary:     Effort: Pulmonary effort is normal. No respiratory distress.     Breath sounds: No wheezing.  Abdominal:     Palpations: Abdomen is soft.     Tenderness: There is no abdominal tenderness.  Musculoskeletal:     Cervical back: Normal range of motion.     Right lower leg: No edema.     Left lower leg: No edema.     Comments: Some bruising over the right fifth metacarpal and some tenderness here as well.  No snuffbox tenderness.  Right upper extremity otherwise unremarkable.  Right lower extremity unremarkable apart from tenderness to palpation of the right knee.  There is a small skin abrasion no lacerations  Left upper and lower extremity without tenderness  No C, T, L-spine tenderness.  No evidence of head trauma.  Skin:    General: Skin is warm and dry.     Capillary Refill: Capillary refill takes less than 2 seconds.  Neurological:     Mental Status: She is alert. Mental status is at baseline.  Psychiatric:        Mood and Affect: Mood normal.        Behavior: Behavior normal.     ED Results / Procedures / Treatments   Labs (all labs ordered are listed, but only abnormal results are displayed) Labs Reviewed - No data to display  EKG None  Radiology DG Wrist Complete Right  Result Date: 11/13/2021 CLINICAL DATA:  Wrist pain fall EXAM: RIGHT WRIST - COMPLETE 3+ VIEW COMPARISON:  None Available. FINDINGS: No fracture or malalignment. Positive for soft tissue swelling. Mild degenerative change at the first [facility] joint. IMPRESSION: No acute osseous abnormality Electronically Signed   By: Donavan Foil M.D.   On: 11/13/2021 17:35   DG Knee Right Port  Result Date: 11/13/2021 CLINICAL DATA:  Fall swelling EXAM: PORTABLE RIGHT KNEE - 1-2 VIEW COMPARISON:  None Available. FINDINGS:  No fracture or malalignment. Mild joint space calcification. Mild tricompartment arthritis. 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 No acute osseous abnormality Electronically Signed   By: Donavan Foil M.D.   On: 11/13/2021 17:34   DG Hand Complete Right  Result Date: 11/13/2021 CLINICAL DATA:  Trauma fall EXAM: RIGHT HAND - COMPLETE 3+ VIEW COMPARISON:  None Available. FINDINGS: Punctate calcifications adjacent to the fifth PIP joint. No malalignment. Joint space narrowing and mild arthritis at the D IP joints. IMPRESSION:  1. Punctate calcifications at the fifth PIP joint may reflect nonspecific soft tissue calcification versus small fracture fragments, correlate for point tenderness. 2. Mild arthritis Electronically Signed   By: Donavan Foil M.D.   On: 11/13/2021 17:32    Procedures Procedures    Medications Ordered in ED Medications - No data to display  ED Course/ Medical Decision Making/ A&P                           Medical Decision Making Amount and/or Complexity of Data Reviewed Radiology: ordered.   Patient is an 81 year old female been me speaking but understands [place].  Her daughter at bedside is translating for her.  She deferred formal translator.  Patient is coming to emergency room today after a mechanical fall that occurred today.  She states that she tripped and fell forward catching herself with her hands and putting weight on her right knee.  She states that she landed on a hardwood floor.  She denies any head injury or loss of consciousness and vomiting blood thinners denies nausea vomiting or any other pain apart from right wrist and hand pain and right knee pain.  She states her left upper and lower extremity are without pain.  She has not had any medications prior to arrival.   She denies any other associated symptoms.    Patient with tenderness of right hand will obtain x-rays of right  hand and right knee.   I obtained x-rays of the right wrist, right knee, right hand.  There are no fractures.  There is some bone spurs noted at the PIP on 5th finger but her TTP is at MCP.   FROM of all fingers. Will DC home w PCP FU.   Return precautions discussed.  Patient agreeable to plan.  Final Clinical Impression(s) / ED Diagnoses Final diagnoses:  Contusion of multiple sites of right hand and wrist, initial encounter  Acute pain of right knee    Rx / DC Orders ED Discharge Orders     None         Tedd Sias, [place] 11/13/21 2034    Regan Lemming, MD 11/13/21 2151

## 2021-11-13 NOTE — Discharge Instructions (Addendum)
Please ice and elevate the areas specifically your right wrist/hand and right knee.  Take Tylenol 1000 mg every 6 hours for pain.

## 2023-12-13 IMAGING — DX DG CHEST 1V PORT
1 series · 1 of 1 positions shown · non-contrast
Comparison: 06/18/2006 chest radiograph

CLINICAL DATA: Shortness of breath and chest pain.

EXAM:
PORTABLE CHEST 1 VIEW

[chest ap]
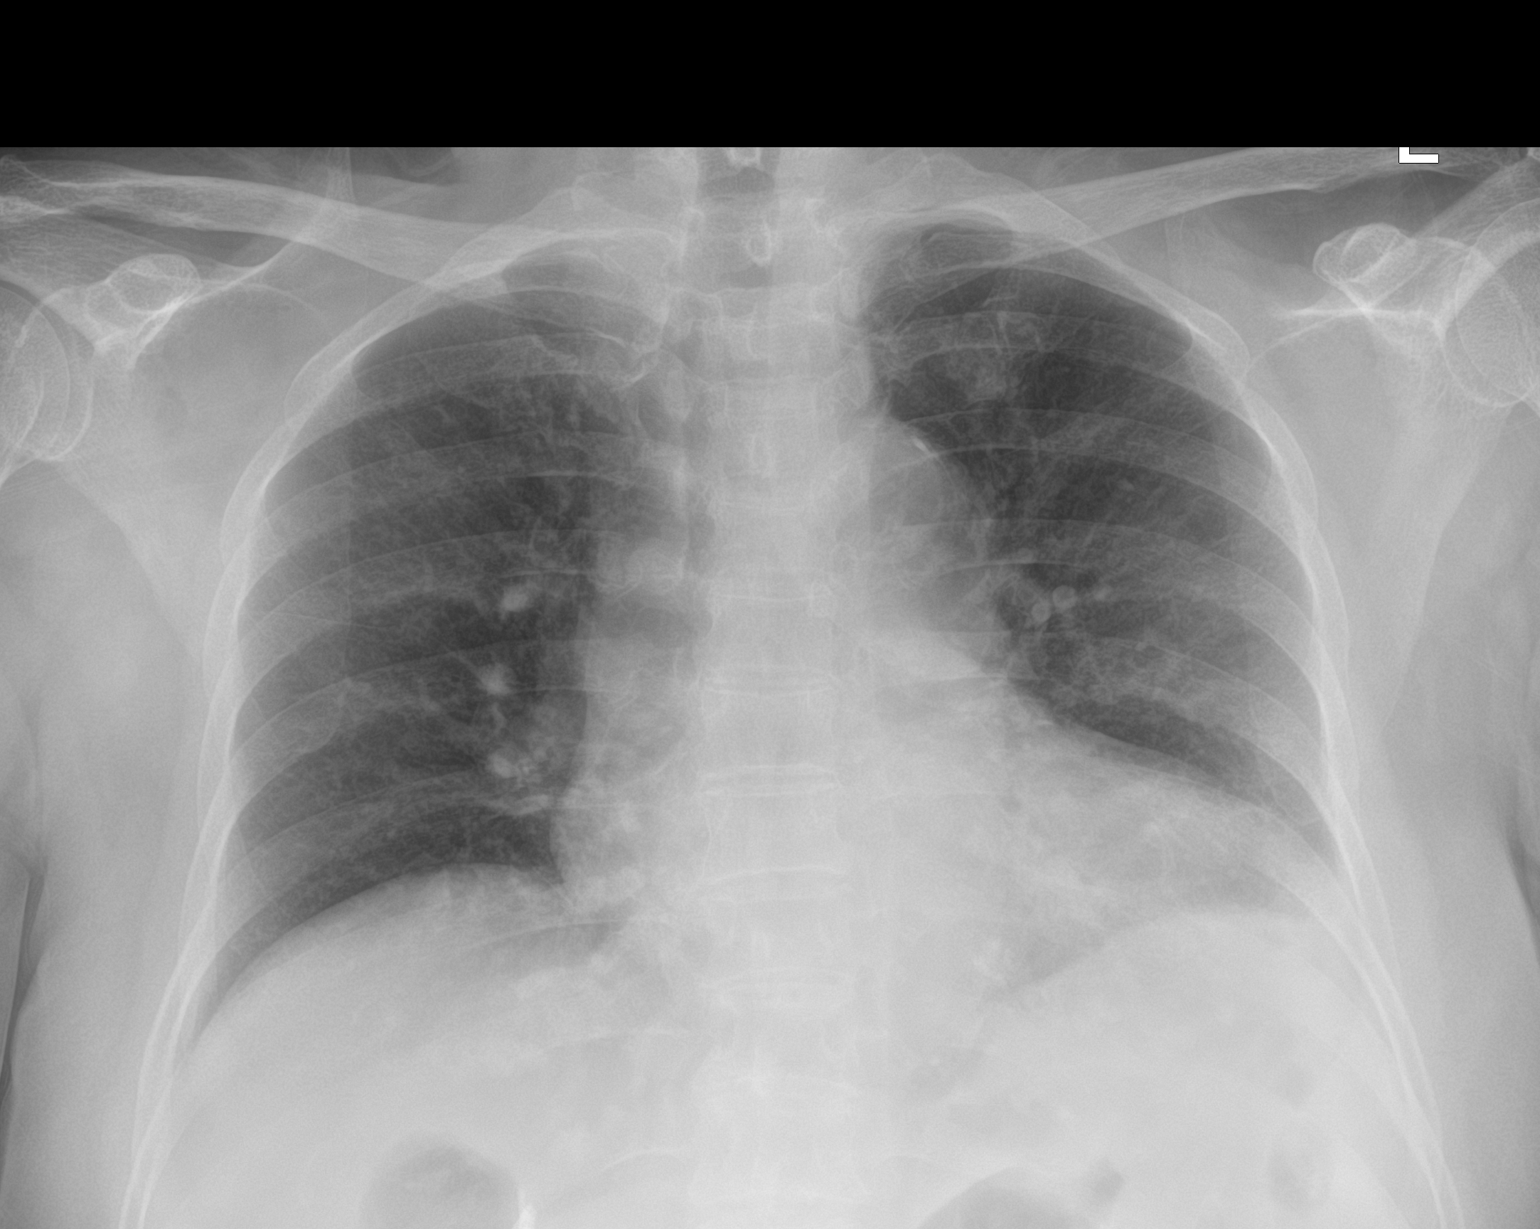

[1 of 1 positions shown; findings below may reference images not displayed]

FINDINGS: This is a mildly low volume study.

The cardiomediastinal silhouette is unremarkable.

Aortic atherosclerotic calcifications are noted.

There is no evidence of focal airspace disease, pulmonary edema,
suspicious pulmonary nodule/mass, pleural effusion, or pneumothorax.

No acute bony abnormalities are identified.
IMPRESSION: No active disease.

## 2023-12-13 IMAGING — CT CT CHEST W/ CM
2 of 3 series · 15 of 36 positions shown, 18 images · IV contrast (agent unspecified)
Comparison: Radiograph earlier today.

CLINICAL DATA: Respiratory illness, nondiagnostic xray

Shortness of breath.  Chest and left shoulder/scapular pain.
EXAM:
CT CHEST WITH CONTRAST
TECHNIQUE: Multidetector CT imaging of the chest was performed during
intravenous contrast administration.

[Series 3: chest with 2mm st · axial · 0.75mm/px · z∈[-288,-6]mm · 12 of 167 slices shown, 15 images]
[im 13/167  mediastinal]
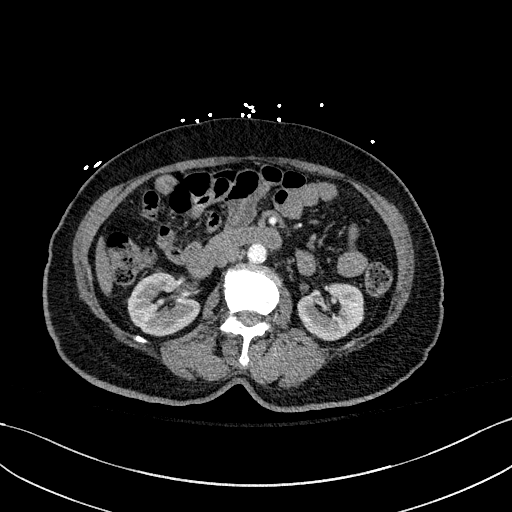
[im 13/167  lung]
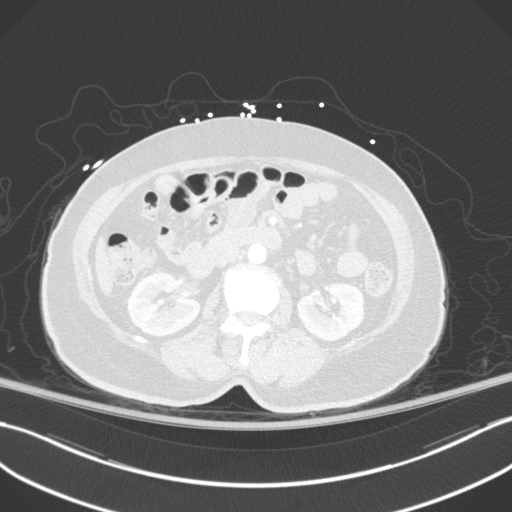
[im 25/167  lung]
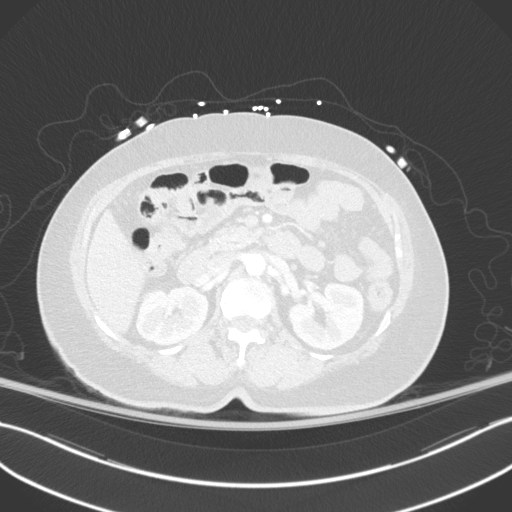
[im 37/167  lung]
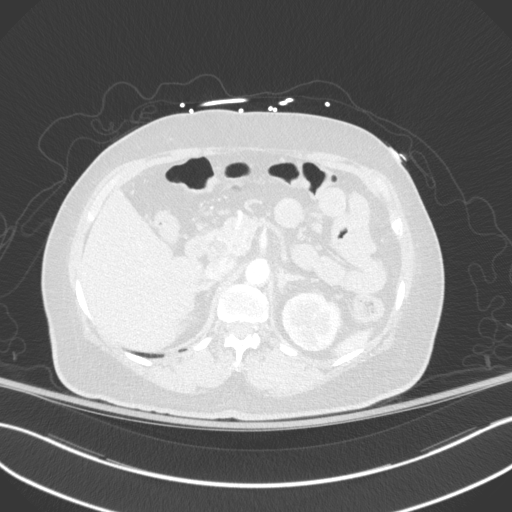
[im 50/167  lung]
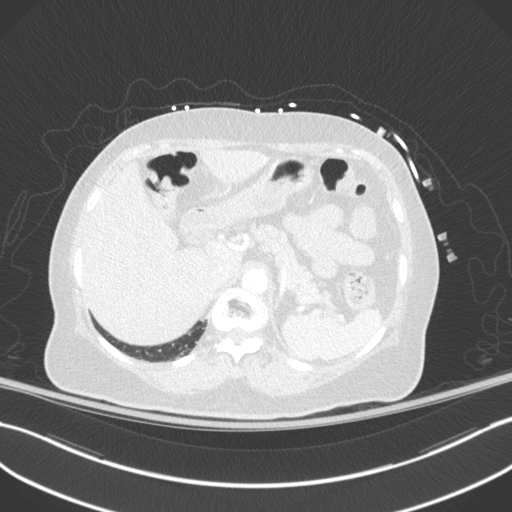
[im 62/167  mediastinal]
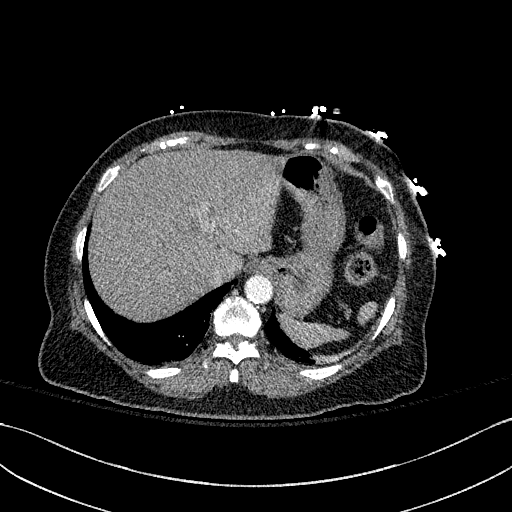
[im 62/167  lung]
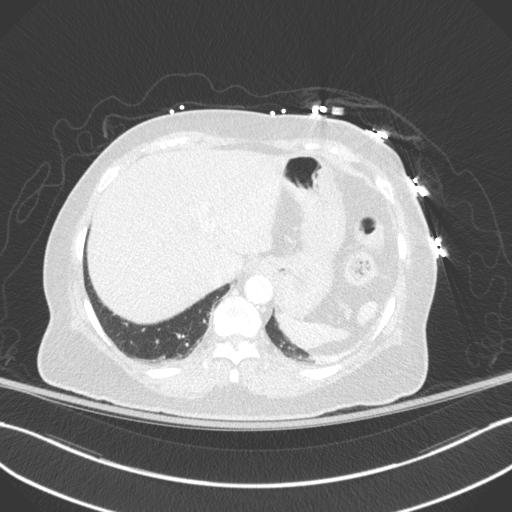
[im 74/167  lung]
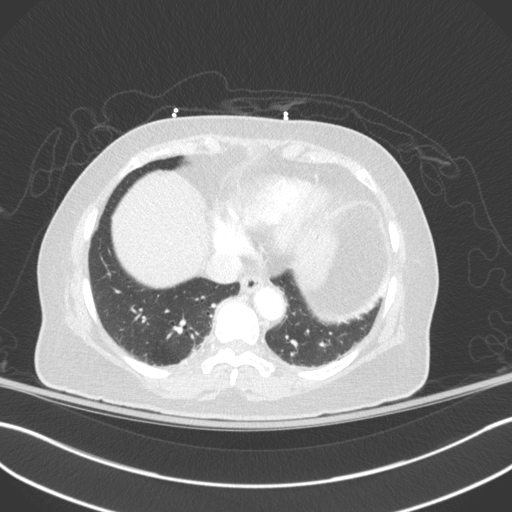
[im 93/167  lung]
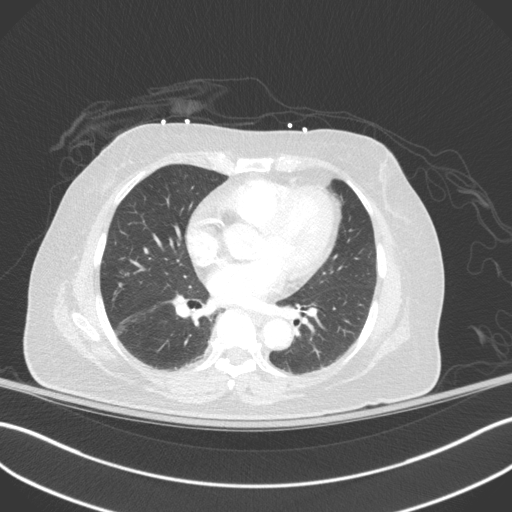
[im 105/167  lung]
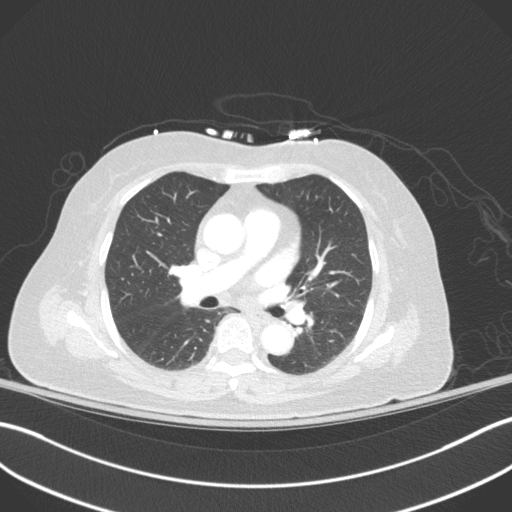
[im 117/167  mediastinal]
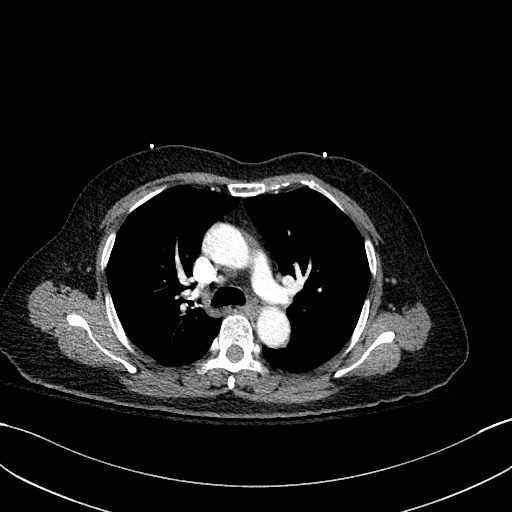
[im 117/167  lung]
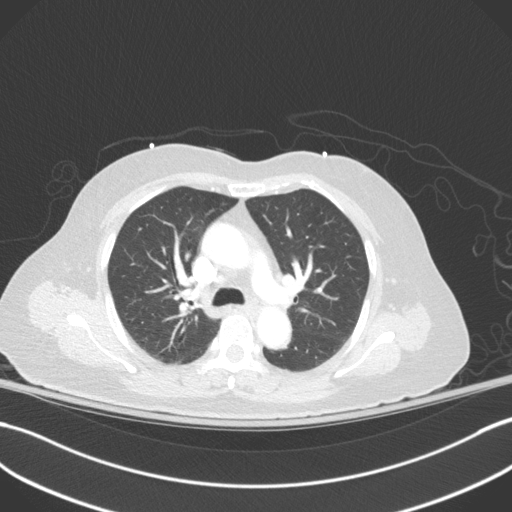
[im 130/167  lung]
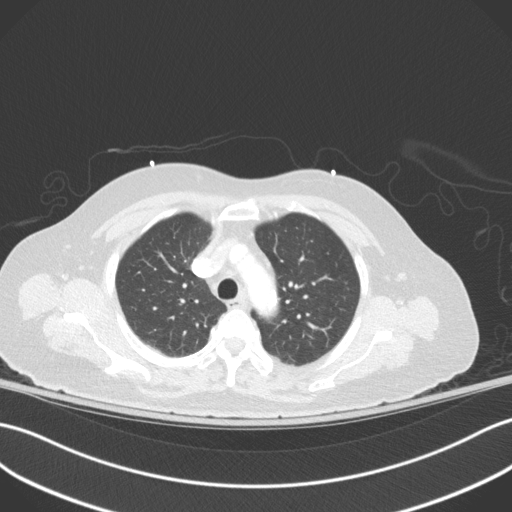
[im 142/167  lung]
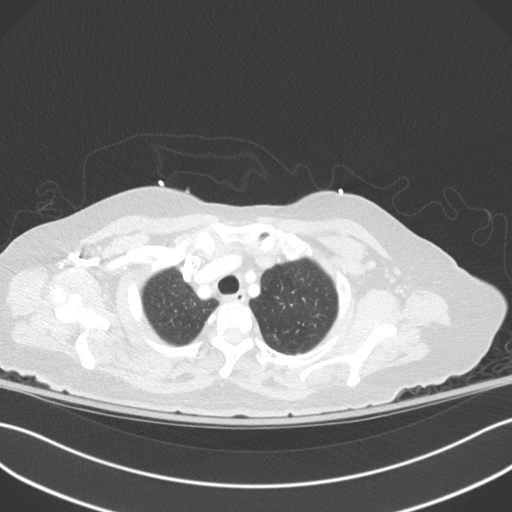
[im 154/167  lung]
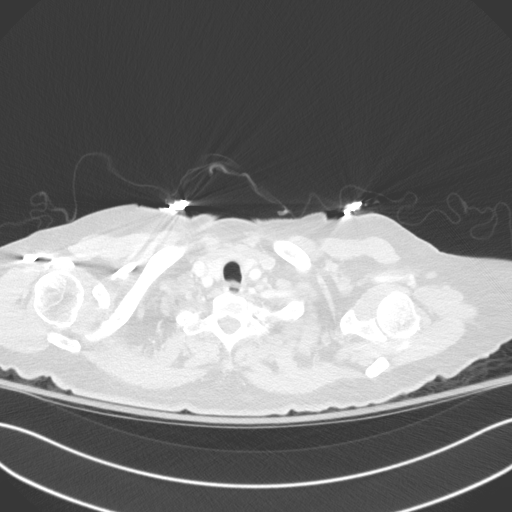

[Series 6: chest with 2mm st cor · coronal · 0.65mm/px · 3 of 125 slices shown]
[im 25/125  lung]
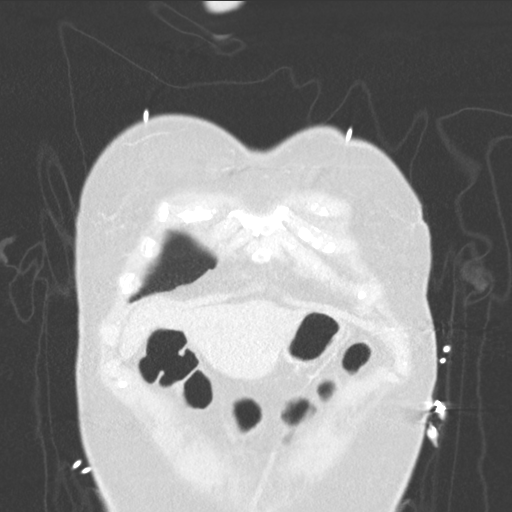
[im 50/125  lung]
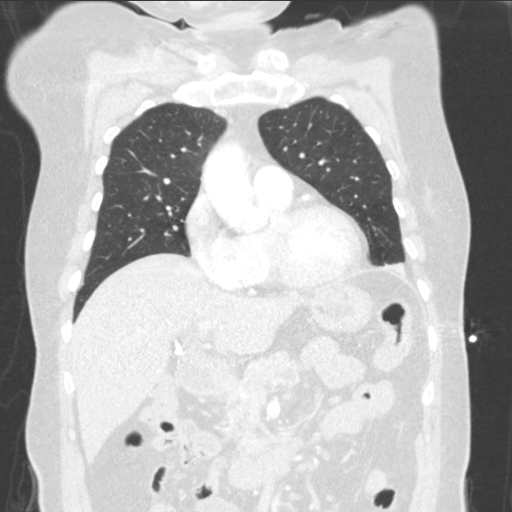
[im 75/125  lung]
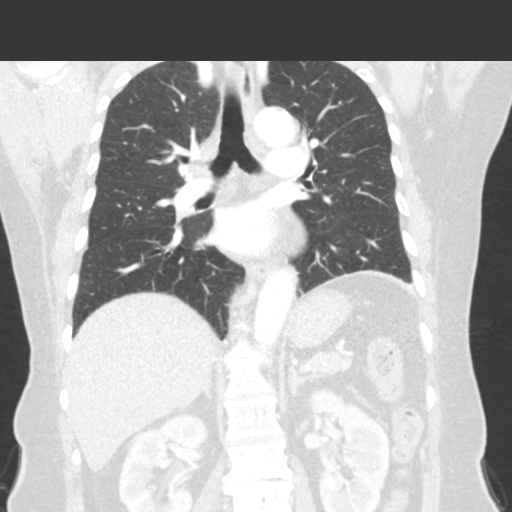

[15 of 36 positions shown; findings below may reference images not displayed]

RADIATION DOSE REDUCTION: This exam was performed according to the
departmental dose-optimization program which includes automated
exposure control, adjustment of the mA and/or kV according to
patient size and/or use of iterative reconstruction technique.

CONTRAST:  100mL OMNIPAQUE IOHEXOL 300 MG/ML  SOLN
FINDINGS: Cardiovascular: Atherosclerosis of the thoracic aorta. Descending
aorta is tortuous. No aneurysm or acute findings. Upper normal heart
size with coronary artery calcifications. No central pulmonary
embolus on this exam not tailored for pulmonary artery assessment.
There is no pericardial effusion.

Mediastinum/Nodes: Small mediastinal lymph nodes not enlarged by
size criteria, typically reactive. There is no hilar adenopathy no
visualized thyroid nodule. Decompressed esophagus.

Lungs/Pleura: Minor subsegmental atelectasis in the left greater
than right lower lobe. A 5 mm subpleural nodule in the right lower
lobe, series 4, image 81, is slightly triangular in shape and may be
partially calcified. No confluent consolidation or evidence of
pneumonia. No pleural effusion. The trachea and central bronchi are
patent.

Upper Abdomen: Cholecystectomy.  No acute upper abdominal findings.

Musculoskeletal: Scattered thoracic degenerative disc disease with
vacuum phenomena at T11-T12. Modic endplate changes at L2-L3 in the
lumbar spine. There is minimal left glenohumeral osteoarthritis. Few
soft tissue calcifications are seen posterior to the humeral head.
No focal scapular abnormality. No focal bone lesion. No chest wall
soft tissue abnormalities.
IMPRESSION: 1. No acute intrathoracic abnormality.
2. Atherosclerosis.  Coronary artery calcifications.
3. A 5 mm subpleural nodule in the right lower lobe is partially
calcified and likely represents a subpleural lymph node or
granuloma. No routine follow-up imaging is recommended per
[HOSPITAL] guidelines.

Aortic Atherosclerosis (WT75C-TE9.9).
